# Patient Record
Sex: Female | Born: 1985 | Hispanic: Yes | Marital: Married | Smoking: Never smoker
Health system: Southern US, Community
[De-identification: ages and names within clinical notes are randomized; demographics above are authoritative.]

---

## 2007-07-27 ENCOUNTER — Emergency Department: Payer: Self-pay | Admitting: Emergency Medicine

## 2009-02-10 ENCOUNTER — Emergency Department: Payer: Self-pay | Admitting: Emergency Medicine

## 2009-11-05 ENCOUNTER — Emergency Department: Payer: Self-pay | Admitting: Emergency Medicine

## 2009-11-17 ENCOUNTER — Emergency Department: Payer: Self-pay | Admitting: Emergency Medicine

## 2010-04-07 ENCOUNTER — Observation Stay: Payer: Self-pay | Admitting: Obstetrics and Gynecology

## 2010-04-27 ENCOUNTER — Ambulatory Visit: Payer: Self-pay | Admitting: Family Medicine

## 2011-07-12 ENCOUNTER — Emergency Department: Payer: Self-pay | Admitting: Emergency Medicine

## 2011-07-12 LAB — URINALYSIS, COMPLETE
Bilirubin,UR: NEGATIVE
Glucose,UR: NEGATIVE mg/dL (ref 0–75)
Ketone: NEGATIVE
Nitrite: NEGATIVE
Protein: NEGATIVE
RBC,UR: 1 /HPF (ref 0–5)
Squamous Epithelial: 7
WBC UR: 2 /HPF (ref 0–5)

## 2011-07-12 LAB — COMPREHENSIVE METABOLIC PANEL
Albumin: 4.2 g/dL (ref 3.4–5.0)
BUN: 17 mg/dL (ref 7–18)
Calcium, Total: 8.9 mg/dL (ref 8.5–10.1)
Chloride: 106 mmol/L (ref 98–107)
Co2: 24 mmol/L (ref 21–32)
EGFR (African American): >60
EGFR (Non-African Amer.): >60
Osmolality: 277 (ref 275–301)
Potassium: 3.9 mmol/L (ref 3.5–5.1)
SGPT (ALT): 30 U/L
Total Protein: 8.2 g/dL (ref 6.4–8.2)

## 2011-07-12 LAB — CBC
HCT: 42.9 % (ref 35.0–47.0)
HGB: 14.2 g/dL (ref 12.0–16.0)
RBC: 4.7 10*6/uL (ref 3.80–5.20)
RDW: 13.3 % (ref 11.5–14.5)

## 2011-07-12 LAB — HCG, QUANTITATIVE, PREGNANCY: Beta Hcg, Quant.: <1 m[IU]/mL — ABNORMAL LOW

## 2014-03-12 ENCOUNTER — Emergency Department: Payer: Self-pay | Admitting: Emergency Medicine

## 2014-03-12 LAB — MAGNESIUM: Magnesium: 1.9 mg/dL

## 2014-03-12 LAB — CBC WITH DIFFERENTIAL/PLATELET
BASOS ABS: 0 10*3/uL (ref 0.0–0.1)
Basophil %: 0.8 %
EOS PCT: 1.7 %
Eosinophil #: 0.1 10*3/uL (ref 0.0–0.7)
HCT: 39.6 % (ref 35.0–47.0)
HGB: 13.3 g/dL (ref 12.0–16.0)
LYMPHS ABS: 2.2 10*3/uL (ref 1.0–3.6)
Lymphocyte %: 40.3 %
MCH: 31.1 pg (ref 26.0–34.0)
MCHC: 33.6 g/dL (ref 32.0–36.0)
MCV: 93 fL (ref 80–100)
Monocyte #: 0.3 x10 3/mm (ref 0.2–0.9)
Monocyte %: 6.4 %
NEUTROS ABS: 2.7 10*3/uL (ref 1.4–6.5)
Neutrophil %: 50.8 %
PLATELETS: 175 10*3/uL (ref 150–440)
RBC: 4.27 10*6/uL (ref 3.80–5.20)
RDW: 13.3 % (ref 11.5–14.5)
WBC: 5.4 10*3/uL (ref 3.6–11.0)

## 2014-03-12 LAB — BASIC METABOLIC PANEL
ANION GAP: 8 (ref 7–16)
BUN: 10 mg/dL (ref 7–18)
CALCIUM: 8.9 mg/dL (ref 8.5–10.1)
CO2: 26 mmol/L (ref 21–32)
CREATININE: 0.62 mg/dL (ref 0.60–1.30)
Chloride: 107 mmol/L (ref 98–107)
EGFR (Non-African Amer.): >60
Glucose: 101 mg/dL — ABNORMAL HIGH (ref 65–99)
Osmolality: 280 (ref 275–301)
Potassium: 3.7 mmol/L (ref 3.5–5.1)
Sodium: 141 mmol/L (ref 136–145)

## 2014-03-12 LAB — TROPONIN I

## 2015-03-03 ENCOUNTER — Other Ambulatory Visit: Payer: Self-pay | Admitting: Internal Medicine

## 2015-03-03 DIAGNOSIS — R102 Pelvic and perineal pain: Secondary | ICD-10-CM

## 2015-03-10 ENCOUNTER — Ambulatory Visit
Admission: RE | Admit: 2015-03-10 | Discharge: 2015-03-10 | Disposition: A | Discharge status: Home / Self Care | Payer: 59 | Source: Ambulatory Visit | Attending: Internal Medicine | Admitting: Internal Medicine

## 2015-03-10 ENCOUNTER — Other Ambulatory Visit (HOSPITAL_COMMUNITY): Payer: Self-pay | Admitting: Interventional Radiology

## 2015-03-10 DIAGNOSIS — R102 Pelvic and perineal pain: Secondary | ICD-10-CM | POA: Diagnosis not present

## 2015-03-10 DIAGNOSIS — Z975 Presence of (intrauterine) contraceptive device: Secondary | ICD-10-CM | POA: Diagnosis not present

## 2016-01-04 ENCOUNTER — Other Ambulatory Visit: Payer: Self-pay | Admitting: Physician Assistant

## 2016-01-04 DIAGNOSIS — R102 Pelvic and perineal pain: Secondary | ICD-10-CM

## 2016-01-09 ENCOUNTER — Ambulatory Visit: Admission: RE | Admit: 2016-01-09 | Payer: 59 | Source: Ambulatory Visit

## 2017-08-13 IMAGING — US US PELVIS COMPLETE
1 series · 14 of 25 positions shown · non-contrast
Comparison: None

CLINICAL DATA: Pelvic pain in female for 3 months.  IUD.

EXAM:
TRANSABDOMINAL AND TRANSVAGINAL ULTRASOUND OF PELVIS
TECHNIQUE: Both transabdominal and transvaginal ultrasound examinations of the
pelvis were performed. Transabdominal technique was performed for
global imaging of the pelvis including uterus, ovaries, adnexal
regions, and pelvic cul-de-sac. It was necessary to proceed with
endovaginal exam following the transabdominal exam to visualize the
endometrial thickness and ovaries.

[Series 1: us pelvis complete · 0.20mm/px · 14 of 111 slices shown]
[im 1/111]
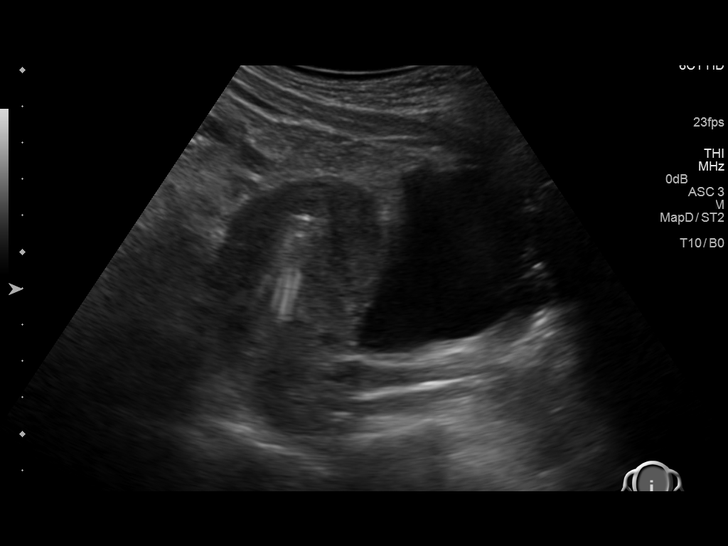
[im 10/111]
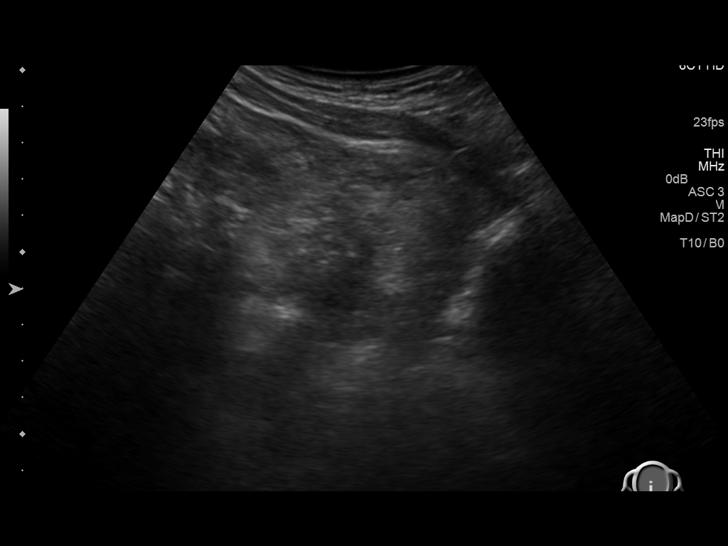
[im 19/111]
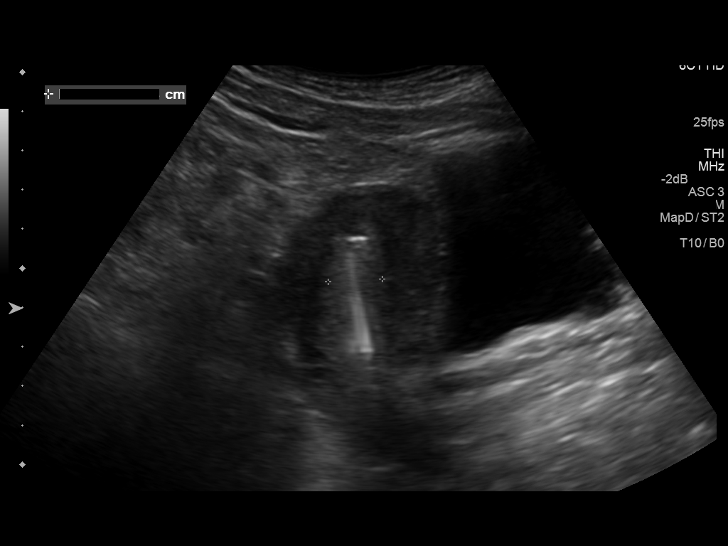
[im 28/111]
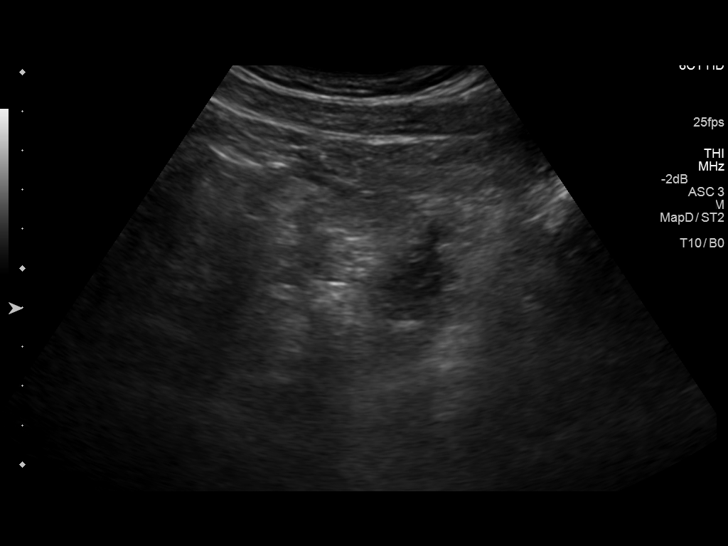
[im 37/111]
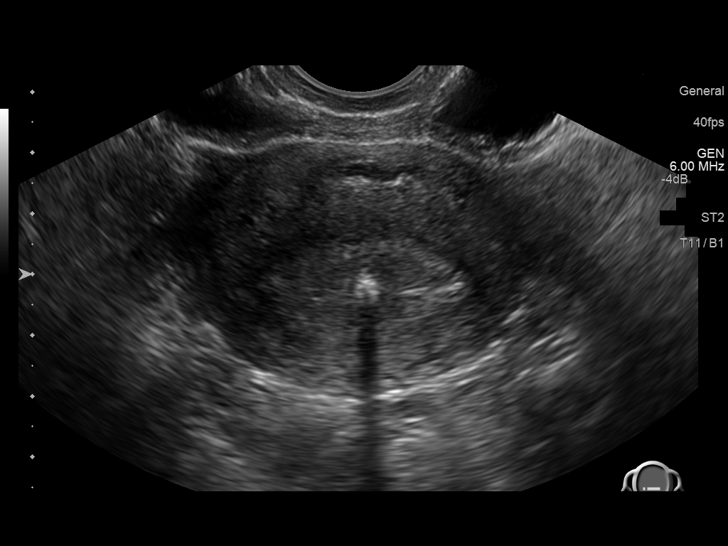
[im 42/111]
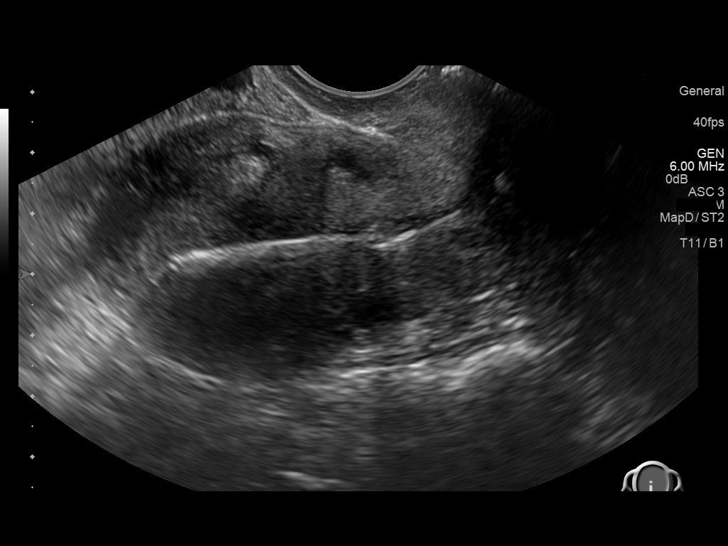
[im 51/111]
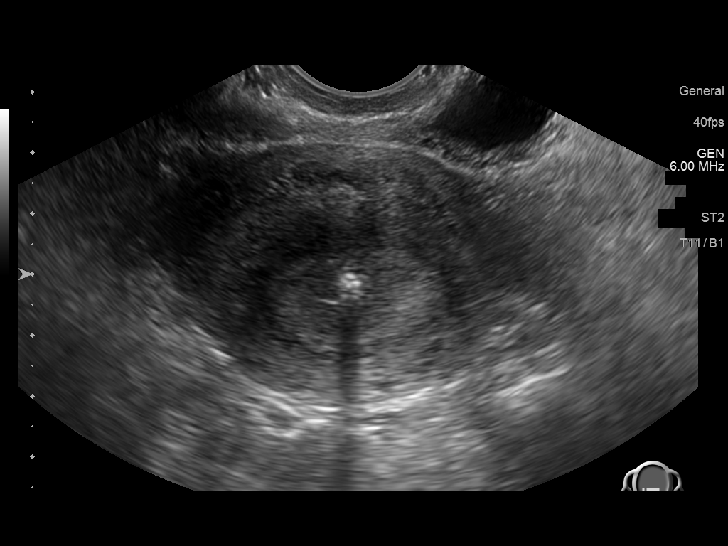
[im 60/111]
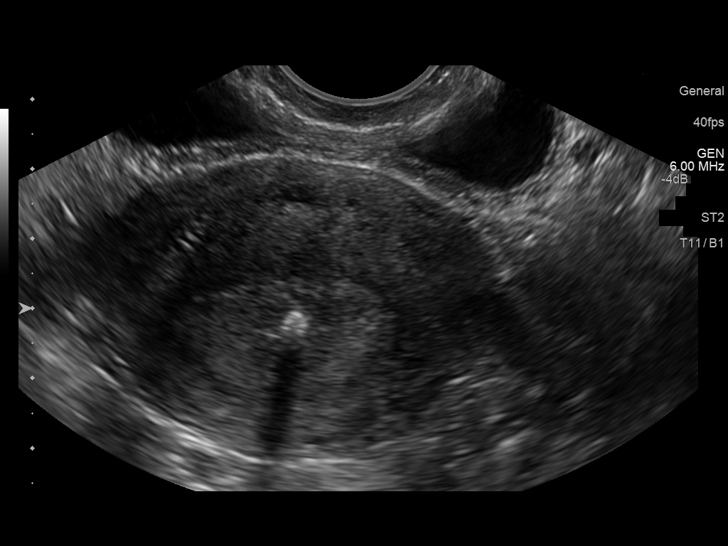
[im 69/111]
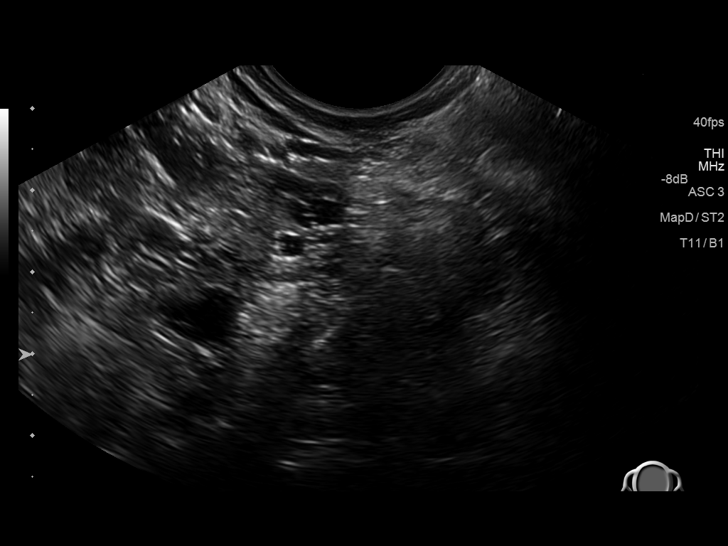
[im 74/111]
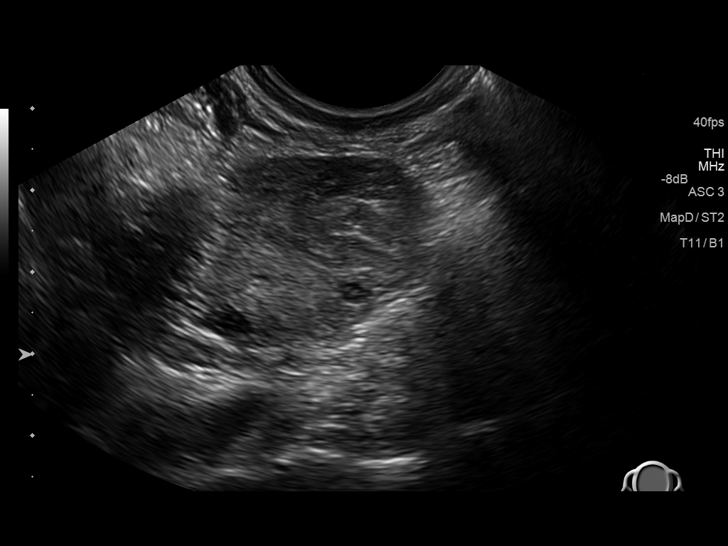
[im 83/111]
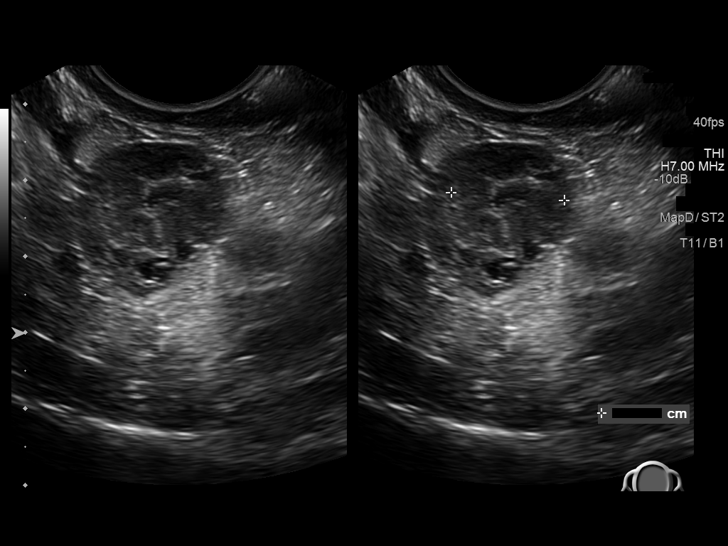
[im 92/111]
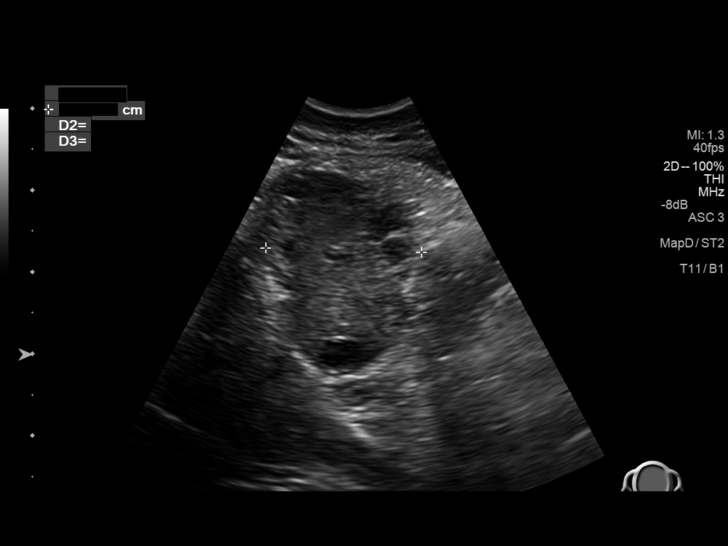
[im 101/111]
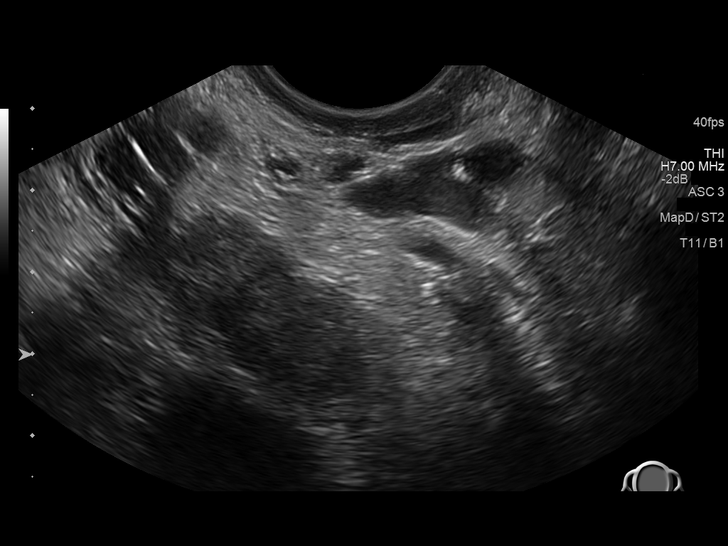
[im 111/111]
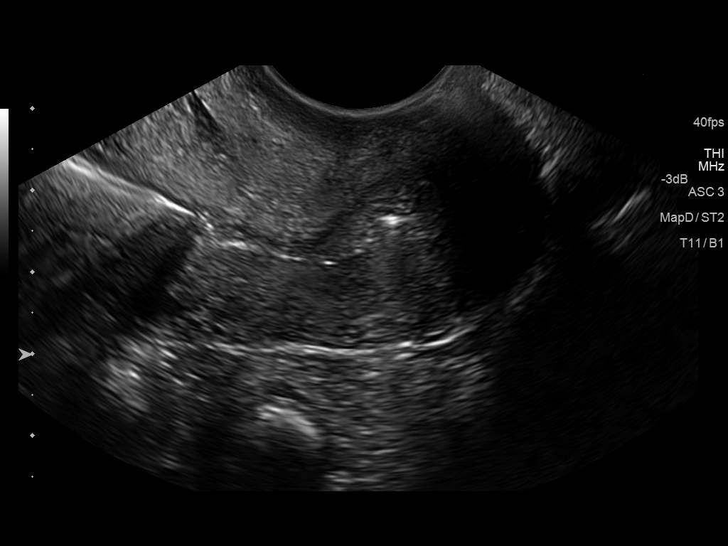

[14 of 25 positions shown; findings below may reference images not displayed]

FINDINGS: Uterus

Measurements: 8.4 x 4.6 x 5.7 cm. No fibroids or other mass
visualized.

Endometrium

Thickness: 18 mm. IUD seen in expected position within endometrial
cavity.

Right ovary

Measurements: 3.2 x 2.3 x 1.9 cm. Small corpus luteum noted. Normal
appearance/no adnexal mass.

Left ovary

Measurements: 3.2 x 1.3 x 1.6 cm. Normal appearance/no adnexal mass.

Other findings

No free fluid.
IMPRESSION: No fibroids identified. Endometrial thickness measures 18 mm, with
IUD visualized within endometrial cavity.

Normal appearance of both ovaries.  No adnexal mass identified.

## 2018-12-06 ENCOUNTER — Encounter: Payer: Self-pay | Admitting: Emergency Medicine

## 2018-12-06 ENCOUNTER — Other Ambulatory Visit: Payer: Self-pay

## 2018-12-06 DIAGNOSIS — M549 Dorsalgia, unspecified: Secondary | ICD-10-CM | POA: Diagnosis present

## 2018-12-06 DIAGNOSIS — N200 Calculus of kidney: Secondary | ICD-10-CM | POA: Diagnosis not present

## 2018-12-06 LAB — COMPREHENSIVE METABOLIC PANEL
ALT: 115 U/L — ABNORMAL HIGH (ref 0–44)
AST: 64 U/L — ABNORMAL HIGH (ref 15–41)
Albumin: 4.6 g/dL (ref 3.5–5.0)
Alkaline Phosphatase: 146 U/L — ABNORMAL HIGH (ref 38–126)
Anion gap: 9 (ref 5–15)
BUN: 17 mg/dL (ref 6–20)
CO2: 24 mmol/L (ref 22–32)
Calcium: 9.5 mg/dL (ref 8.9–10.3)
Chloride: 109 mmol/L (ref 98–111)
Creatinine, Ser: 0.7 mg/dL (ref 0.44–1.00)
GFR calc Af Amer: >60 mL/min (ref >60)
GFR calc non Af Amer: >60 mL/min (ref >60)
Glucose, Bld: 111 mg/dL — ABNORMAL HIGH (ref 70–99)
Potassium: 3.8 mmol/L (ref 3.5–5.1)
Sodium: 142 mmol/L (ref 135–145)
Total Bilirubin: 0.6 mg/dL (ref 0.3–1.2)
Total Protein: 7.6 g/dL (ref 6.5–8.1)

## 2018-12-06 LAB — CBC
HCT: 39.2 % (ref 36.0–46.0)
Hemoglobin: 13.1 g/dL (ref 12.0–15.0)
MCH: 29.6 pg (ref 26.0–34.0)
MCHC: 33.4 g/dL (ref 30.0–36.0)
MCV: 88.5 fL (ref 80.0–100.0)
Platelets: 196 10*3/uL (ref 150–400)
RBC: 4.43 MIL/uL (ref 3.87–5.11)
RDW: 12.9 % (ref 11.5–15.5)
WBC: 7.2 10*3/uL (ref 4.0–10.5)
nRBC: 0 % (ref 0.0–0.2)

## 2018-12-06 LAB — URINALYSIS, COMPLETE (UACMP) WITH MICROSCOPIC
Bilirubin Urine: NEGATIVE
Glucose, UA: NEGATIVE mg/dL
Ketones, ur: NEGATIVE mg/dL
Nitrite: NEGATIVE
Protein, ur: 100 mg/dL — AB
RBC / HPF: >50 RBC/hpf — ABNORMAL HIGH (ref 0–5)
Specific Gravity, Urine: 1.025 (ref 1.005–1.030)
pH: 8 (ref 5.0–8.0)

## 2018-12-06 LAB — POCT PREGNANCY, URINE: Preg Test, Ur: NEGATIVE

## 2018-12-06 LAB — LIPASE, BLOOD: Lipase: 35 U/L (ref 11–51)

## 2018-12-06 MED ORDER — KETOROLAC TROMETHAMINE 30 MG/ML IJ SOLN
30.0000 mg | Freq: Once | INTRAMUSCULAR | Status: AC
  Administered 2018-12-06: 30 mg via INTRAVENOUS
  Filled 2018-12-06: qty 1

## 2018-12-06 MED ORDER — SODIUM CHLORIDE 0.9 % IV BOLUS
1000.0000 mL | Freq: Once | INTRAVENOUS | Status: AC
  Administered 2018-12-06: 1000 mL via INTRAVENOUS

## 2018-12-06 MED ORDER — ONDANSETRON HCL 4 MG/2ML IJ SOLN
4.0000 mg | Freq: Once | INTRAMUSCULAR | Status: AC
  Administered 2018-12-06: 23:00:00 4 mg via INTRAVENOUS
  Filled 2018-12-06: qty 2

## 2018-12-06 MED ORDER — FENTANYL CITRATE (PF) 100 MCG/2ML IJ SOLN
50.0000 ug | INTRAMUSCULAR | Status: DC | PRN
  Administered 2018-12-06: 22:00:00 50 ug via INTRAVENOUS
  Filled 2018-12-06: qty 2

## 2018-12-06 NOTE — ED Notes (Signed)
Pt in bathroom to attempt to collect urine specimen; unsure she can void at this time but she will try

## 2018-12-06 NOTE — ED Notes (Signed)
Pt understands she needs to pump her breast milk for the next 24 hours and dump it out;

## 2018-12-06 NOTE — ED Triage Notes (Signed)
Pt reports left back pain that radiates around the left flank to left lower abd; denies fever; some nausea, no vomiting; reports frank blood in her urine; pt moaning in wheelchair;

## 2018-12-06 NOTE — ED Notes (Signed)
Pt assisted to BR via wheelchair to void; voices no relief from pain medication yet

## 2018-12-06 NOTE — ED Notes (Signed)
Pt able to void small amount; small blood clots noted in urine; pt in worse pain than before she voided

## 2018-12-07 ENCOUNTER — Emergency Department
Admission: EM | Admit: 2018-12-07 | Discharge: 2018-12-07 | Disposition: A | Discharge status: Home / Self Care | Payer: 59 | Attending: Emergency Medicine | Admitting: Emergency Medicine

## 2018-12-07 DIAGNOSIS — N200 Calculus of kidney: Secondary | ICD-10-CM

## 2018-12-07 MED ORDER — KETOROLAC TROMETHAMINE 10 MG PO TABS: 10.0000 mg | ORAL_TABLET | Freq: Three times a day (TID) | ORAL | 0 refills | Status: AC | PRN

## 2018-12-07 MED ORDER — ONDANSETRON HCL 4 MG PO TABS: 4.0000 mg | ORAL_TABLET | Freq: Three times a day (TID) | ORAL | 0 refills | Status: AC | PRN

## 2018-12-07 NOTE — ED Notes (Signed)
Peripheral IV discontinued. Catheter intact. No signs of infiltration or redness. Gauze applied to IV site.    Discharge instructions reviewed with patient. Questions fielded by this RN. Patient verbalizes understanding of instructions. Patient discharged home in stable condition per goodman. No acute distress noted at time of discharge.    

## 2018-12-07 NOTE — ED Provider Notes (Signed)
[facility] Emergency Department Provider Note  ____________________________________________   I have reviewed the triage vital signs and the nursing notes.   HISTORY  Chief Complaint Hematuria, Back Pain, Flank Pain, and Abdominal Pain   History limited by: Language Barrier - Hospital Interpreter utilized   HPI Jocelyn Bean is a 33 y.o. female who presents to the emergency department today because of concern for left sided back and abdominal pain.  She states the pain started at roughly 9 PM yesterday evening.  She just eaten a little bit before the pain started.  She states the pain initially was in her back and then started radiating towards her abdomen.  She did have associated nausea and vomiting.  She did notice some blood in her urine when she gave a urine sample today.  Denies any recent dysuria or bad odor to her urine.  Denies any fevers.  Denies similar symptoms in the past.  By the time my exam the patient states that she felt much improved.  She thought that the second medication given to her for pain helped significantly.   Records reviewed. Per medical record review patient has a history of recent pregnancy.   History reviewed. No pertinent past medical history.  There are no active problems to display for this patient.   History reviewed. No pertinent surgical history.  Prior to Admission medications   Not on File    Allergies Patient has no known allergies.  History reviewed. No pertinent family history.  Social History Social History   Tobacco Use  . Smoking status: Never Smoker  . Smokeless tobacco: Never Used  Substance Use Topics  . Alcohol use: Never    Frequency: Never  . Drug use: Never    Review of Systems Constitutional: No fever/chills Eyes: No visual changes. ENT: No sore throat. Cardiovascular: Denies chest pain. Respiratory: Denies shortness of breath. Gastrointestinal: Positive for left abdominal pain,  nausea and vomiting.   Genitourinary: Negative for dysuria. Musculoskeletal: Positive for left lower back pain. Skin: Negative for rash. Neurological: Negative for headaches, focal weakness or numbness.  ____________________________________________   PHYSICAL EXAM:  VITAL SIGNS: ED Triage Vitals  Enc Vitals Group     BP 12/06/18 2200 (!) 142/81     Pulse Rate 12/06/18 2200 70     Resp 12/06/18 2200 17     Temp 12/06/18 2200 98.2 F (36.8 C)     Temp Source 12/06/18 2200 Oral     SpO2 12/06/18 2200 100 %     Weight 12/06/18 2155 125 lb (56.7 kg)     Height 12/06/18 2155 5' 3" (1.6 m)     Head Circumference --      Peak Flow --      Pain Score 12/06/18 2155 10   Constitutional: Alert and oriented.  Eyes: Conjunctivae are normal.  ENT      Head: Normocephalic and atraumatic.      Nose: No congestion/rhinnorhea.      Mouth/Throat: Mucous membranes are moist.      Neck: No stridor. Hematological/Lymphatic/Immunilogical: No cervical lymphadenopathy. Cardiovascular: Normal rate, regular rhythm.  No murmurs, rubs, or gallops.  Respiratory: Normal respiratory effort without tachypnea nor retractions. Breath sounds are clear and equal bilaterally. No wheezes/rales/rhonchi. Gastrointestinal: Soft and non tender. No rebound. No guarding.  Genitourinary: Deferred Musculoskeletal: Normal range of motion in all extremities. No lower extremity edema. Neurologic:  Normal speech and language. No gross focal neurologic deficits are appreciated.  Skin:  Skin   is warm, dry and intact. No rash noted. Psychiatric: Mood and affect are normal. Speech and behavior are normal. Patient exhibits appropriate insight and judgment.  ____________________________________________    LABS (pertinent positives/negatives)  Upreg negative CMP wnl except glu 111, ast 64, alt 115, alk phos 146 UA cloudy, large hgb dipstick, protein 100, small leukocytes, >50rbcs, 21-50 wbc, rare bacteria, 11-20 squamous  epi CBC ____________________________________________   EKG  None  ____________________________________________    RADIOLOGY  None  ____________________________________________   PROCEDURES  Procedures  ____________________________________________   INITIAL IMPRESSION / ASSESSMENT AND PLAN / ED COURSE  Pertinent labs & imaging results that were available during my care of the patient were reviewed by me and considered in my medical decision making (see chart for details).   Patient presented to the emergency department today because of concerns for left lower back pain that radiated to her abdomen.  This was accompanied by nausea and vomiting.  Urine does have red blood cells.  Patient felt better after Toradol shot.  I had a discussion with the patient.  At this point given clinical history and response to medication I do think she likely suffering from a kidney stone.  Did discuss with patient that the best way to tell for sure would be a CT scan however we did discuss the risk of radiation.  Patient felt comfortable deferring CT scan at this time which I think is completely reasonable.  Discussed treatment for kidney stones.  The patient is currently breast-feeding so did recommend switching to formula for the time being if she required further pain medication.  Did discuss return precautions including fever.   ____________________________________________   FINAL CLINICAL IMPRESSION(S) / ED DIAGNOSES  Final diagnoses:  Kidney stone     Note: This dictation was prepared with Dragon dictation. Any transcriptional errors that result from this process are unintentional     Nance Pear, MD 12/07/18 [id number]

## 2018-12-07 NOTE — Discharge Instructions (Addendum)
Please seek medical attention for any high fevers, chest pain, shortness of breath, change in behavior, persistent vomiting, bloody stool or any other new or concerning symptoms.  

## 2018-12-07 NOTE — ED Notes (Addendum)
Reports pain to left lower back that radiates into left abd area. Also c/o of n/v. Reports 0/10 pain at present time. States medication given really helped.

## 2020-01-21 ENCOUNTER — Other Ambulatory Visit: Payer: Self-pay

## 2020-01-21 ENCOUNTER — Emergency Department
Admission: EM | Admit: 2020-01-21 | Discharge: 2020-01-21 | Disposition: A | Discharge status: Home / Self Care | Payer: PRIVATE HEALTH INSURANCE | Attending: Emergency Medicine | Admitting: Emergency Medicine

## 2020-01-21 ENCOUNTER — Emergency Department: Payer: PRIVATE HEALTH INSURANCE

## 2020-01-21 DIAGNOSIS — R1030 Lower abdominal pain, unspecified: Secondary | ICD-10-CM | POA: Insufficient documentation

## 2020-01-21 DIAGNOSIS — O99352 Diseases of the nervous system complicating pregnancy, second trimester: Secondary | ICD-10-CM | POA: Diagnosis present

## 2020-01-21 DIAGNOSIS — R55 Syncope and collapse: Secondary | ICD-10-CM | POA: Insufficient documentation

## 2020-01-21 DIAGNOSIS — O26892 Other specified pregnancy related conditions, second trimester: Secondary | ICD-10-CM | POA: Diagnosis not present

## 2020-01-21 DIAGNOSIS — Z3A14 14 weeks gestation of pregnancy: Secondary | ICD-10-CM | POA: Diagnosis not present

## 2020-01-21 DIAGNOSIS — R102 Pelvic and perineal pain: Secondary | ICD-10-CM

## 2020-01-21 DIAGNOSIS — M549 Dorsalgia, unspecified: Secondary | ICD-10-CM | POA: Diagnosis not present

## 2020-01-21 LAB — CBC
HCT: 34.8 % — ABNORMAL LOW (ref 36.0–46.0)
Hemoglobin: 12 g/dL (ref 12.0–15.0)
MCH: 31.2 pg (ref 26.0–34.0)
MCHC: 34.5 g/dL (ref 30.0–36.0)
MCV: 90.4 fL (ref 80.0–100.0)
Platelets: 172 10*3/uL (ref 150–400)
RBC: 3.85 MIL/uL — ABNORMAL LOW (ref 3.87–5.11)
RDW: 13.6 % (ref 11.5–15.5)
WBC: 6.6 10*3/uL (ref 4.0–10.5)
nRBC: 0 % (ref 0.0–0.2)

## 2020-01-21 LAB — URINALYSIS, COMPLETE (UACMP) WITH MICROSCOPIC
Bilirubin Urine: NEGATIVE
Glucose, UA: NEGATIVE mg/dL
Hgb urine dipstick: NEGATIVE
Ketones, ur: NEGATIVE mg/dL
Leukocytes,Ua: NEGATIVE
Nitrite: NEGATIVE
Protein, ur: NEGATIVE mg/dL
Specific Gravity, Urine: 1.012 (ref 1.005–1.030)
pH: 7 (ref 5.0–8.0)

## 2020-01-21 LAB — BASIC METABOLIC PANEL
Anion gap: 8 (ref 5–15)
BUN: 8 mg/dL (ref 6–20)
CO2: 21 mmol/L — ABNORMAL LOW (ref 22–32)
Calcium: 8.9 mg/dL (ref 8.9–10.3)
Chloride: 106 mmol/L (ref 98–111)
Creatinine, Ser: 0.35 mg/dL — ABNORMAL LOW (ref 0.44–1.00)
GFR, Estimated: >60 mL/min (ref >60)
Glucose, Bld: 113 mg/dL — ABNORMAL HIGH (ref 70–99)
Potassium: 3.9 mmol/L (ref 3.5–5.1)
Sodium: 135 mmol/L (ref 135–145)

## 2020-01-21 LAB — POC URINE PREG, ED: Preg Test, Ur: POSITIVE — AB

## 2020-01-21 NOTE — ED Triage Notes (Signed)
Reports abdominal pain, lower back pain and episode of near syncope this AM. Pt states [redacted] weeks pregnant, no vaginal bleeding.  Pt alert and oriented X4, cooperative, RR even and unlabored, color WNL. Pt in NAD. interpreter used for triage.

## 2020-01-21 NOTE — ED Notes (Signed)
Ambulatory into ED. Interpreter requested.
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
## 2021-05-05 ENCOUNTER — Ambulatory Visit
Admission: EM | Admit: 2021-05-05 | Discharge: 2021-05-05 | Disposition: A | Discharge status: Home / Self Care | Payer: PRIVATE HEALTH INSURANCE

## 2021-05-05 ENCOUNTER — Encounter: Payer: Self-pay | Admitting: Emergency Medicine

## 2021-05-05 ENCOUNTER — Other Ambulatory Visit: Payer: Self-pay

## 2021-05-05 DIAGNOSIS — B349 Viral infection, unspecified: Secondary | ICD-10-CM | POA: Diagnosis not present

## 2021-05-05 NOTE — ED Triage Notes (Signed)
Pt c/o HA, abdominal pain, bodyaches, dizziness, and diarrhea sxs started last night.

## 2021-05-05 NOTE — Discharge Instructions (Addendum)
Your COVID and Flu tests are pending.  You should self quarantine until the test results are back.    Take Tylenol or ibuprofen as needed for fever or discomfort.  Rest and keep yourself hydrated.    Follow-up with your primary care provider if your symptoms are not improving.     

## 2021-05-05 NOTE — ED Provider Notes (Signed)
[facility]    CSN: [id number] Arrival date & time: 05/05/21  [id number]      History   Chief Complaint Chief Complaint  Patient presents with   Headache   Abdominal Pain   Generalized Body Aches   Dizziness   Diarrhea    HPI Jocelyn Bean is a 36 y.o. female.  Patient presents with headache, body aches, dizziness, abdominal pain, nausea, and diarrhea since last night.  She denies fever, chills, rash, ear pain, sore throat, cough, shortness of breath, vomiting, dysuria, hematuria, or other symptoms.  Treatment at home with Tylenol and ibuprofen.  She has an infant and is breast-feeding.  The history is provided by the patient. A language interpreter was used.   History reviewed. No pertinent past medical history.  There are no problems to display for this patient.   History reviewed. No pertinent surgical history.  OB History     Gravida  1   Para      Term      Preterm      AB      Living         SAB      IAB      Ectopic      Multiple      Live Births               Home Medications    Prior to Admission medications   Medication Sig Start Date End Date Taking? Authorizing Provider  Prenatal Vit-Iron Carbonyl-FA (PRENATABS RX) 29-1 MG TABS Take 1 tablet by mouth daily. 04/18/20  Yes [provider]  ketorolac (TORADOL) 10 MG tablet Take 1 tablet (10 mg total) by mouth every 8 (eight) hours as needed for severe pain. 12/07/18   Nance Pear, MD  norethindrone-ethinyl estradiol (LOESTRIN) 1-20 MG-MCG tablet Take 1 tablet by mouth daily. 10/17/17   [provider]  ondansetron (ZOFRAN) 4 MG tablet Take 1 tablet (4 mg total) by mouth every 8 (eight) hours as needed. 12/07/18   Nance Pear, MD    Family History History reviewed. No pertinent family history.  Social History Social History   Tobacco Use   Smoking status: Never   Smokeless tobacco: Never  Vaping Use   Vaping Use: Never used  Substance Use  Topics   Alcohol use: Never   Drug use: Never     Allergies   Patient has no known allergies.   Review of Systems Review of Systems  Constitutional:  Negative for chills and fever.  HENT:  Negative for ear pain and sore throat.   Respiratory:  Negative for cough and shortness of breath.   Cardiovascular:  Negative for chest pain and palpitations.  Gastrointestinal:  Positive for abdominal pain, diarrhea and nausea. Negative for vomiting.  Genitourinary:  Negative for dysuria and hematuria.  Skin:  Negative for color change and rash.  Neurological:  Positive for dizziness and headaches. Negative for weakness and numbness.  All other systems reviewed and are negative.   Physical Exam Triage Vital Signs ED Triage Vitals  Enc Vitals Group     BP 05/05/21 1838 109/76     Pulse Rate 05/05/21 1838 83     Resp 05/05/21 1838 18     Temp 05/05/21 1838 98.2 F (36.8 C)     Temp src --      SpO2 05/05/21 1838 98 %     Weight --      Height --  Head Circumference --      Peak Flow --      Pain Score 05/05/21 1846 5     Pain Loc --      Pain Edu? --      Excl. in [place]? --    No data found.  Updated Vital Signs BP 109/76    Pulse 83    Temp 98.2 F (36.8 C)    Resp 18    SpO2 98%    Breastfeeding Yes   Visual Acuity Right Eye Distance:   Left Eye Distance:   Bilateral Distance:    Right Eye Near:   Left Eye Near:    Bilateral Near:     Physical Exam Vitals and nursing note reviewed.  Constitutional:      General: She is not in acute distress.    Appearance: Normal appearance. She is well-developed. She is not ill-appearing.  HENT:     Right Ear: Tympanic membrane normal.     Left Ear: Tympanic membrane normal.     Nose: Nose normal.     Mouth/Throat:     Mouth: Mucous membranes are moist.     Pharynx: Oropharynx is clear.  Cardiovascular:     Rate and Rhythm: Normal rate and regular rhythm.     Heart sounds: Normal heart sounds.  Pulmonary:     Effort:  Pulmonary effort is normal. No respiratory distress.     Breath sounds: Normal breath sounds.  Abdominal:     General: Bowel sounds are normal.     Palpations: Abdomen is soft.     Tenderness: There is no abdominal tenderness. There is no guarding or rebound.  Musculoskeletal:     Cervical back: Neck supple.  Skin:    General: Skin is warm and dry.  Neurological:     General: No focal deficit present.     Mental Status: She is alert and oriented to person, place, and time.     Sensory: No sensory deficit.     Motor: No weakness.     Gait: Gait normal.  Psychiatric:        Mood and Affect: Mood normal.        Behavior: Behavior normal.     UC Treatments / Results  Labs (all labs ordered are listed, but only abnormal results are displayed) Labs Reviewed  COVID-19, FLU A+B NAA    EKG   Radiology No results found.  Procedures Procedures (including critical care time)  Medications Ordered in UC Medications - No data to display  Initial Impression / Assessment and Plan / UC Course  I have reviewed the triage vital signs and the nursing notes.  Pertinent labs & imaging results that were available during my care of the patient were reviewed by me and considered in my medical decision making (see chart for details).    Viral illness.  Patient is breast-feeding. COVID and Flu pending.  Instructed patient to self quarantine per CDC guidelines.  Discussed symptomatic treatment including Tylenol or ibuprofen, rest, hydration.  Instructed patient to follow up with PCP if symptoms are not improving.  Patient agrees to plan of care.   Final Clinical Impressions(s) / UC Diagnoses   Final diagnoses:  Viral illness     Discharge Instructions      Your COVID and Flu tests are pending.  You should self quarantine until the test results are back.    Take Tylenol or ibuprofen as needed for fever or discomfort.  Rest and  keep yourself hydrated.    Follow-up with your primary  care provider if your symptoms are not improving.         ED Prescriptions   None    PDMP not reviewed this encounter.   Sharion Balloon, NP 05/05/21 [phone]

## 2021-05-08 LAB — COVID-19, FLU A+B NAA
Influenza A, NAA: NOT DETECTED
Influenza B, NAA: NOT DETECTED
SARS-CoV-2, NAA: NOT DETECTED

## 2022-05-11 IMAGING — US US OB COMP LESS 14 WK
1 series · 14 of 28 positions shown · non-contrast
Comparison: None

CLINICAL DATA: Lower pelvic pain, pregnant; assigned EGA 13 weeks 6
days

EXAM:
OBSTETRIC <14 WK ULTRASOUND
TECHNIQUE: Transabdominal ultrasound was performed for evaluation of the
gestation as well as the maternal uterus and adnexal regions.

[Series 1: ob us · 14 of 41 slices shown]
[im 2/41]
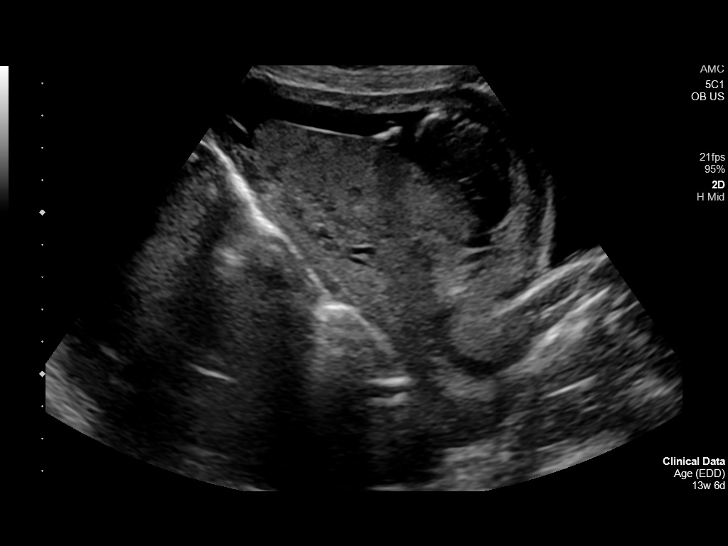
[im 5/41]
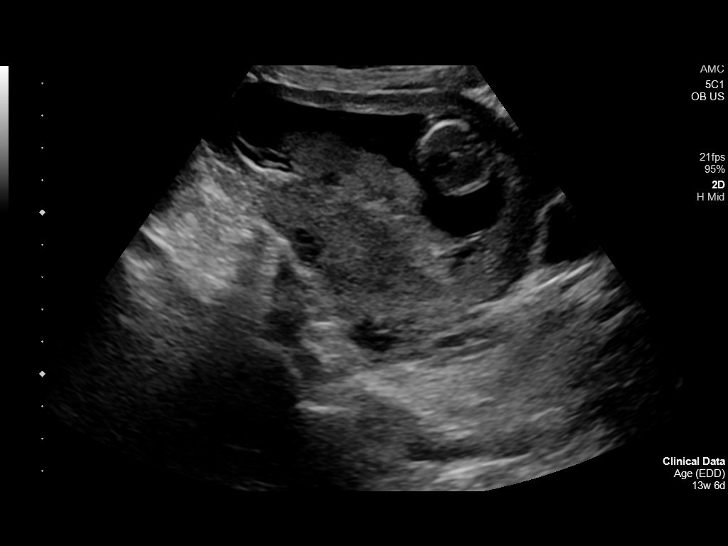
[im 8/41]
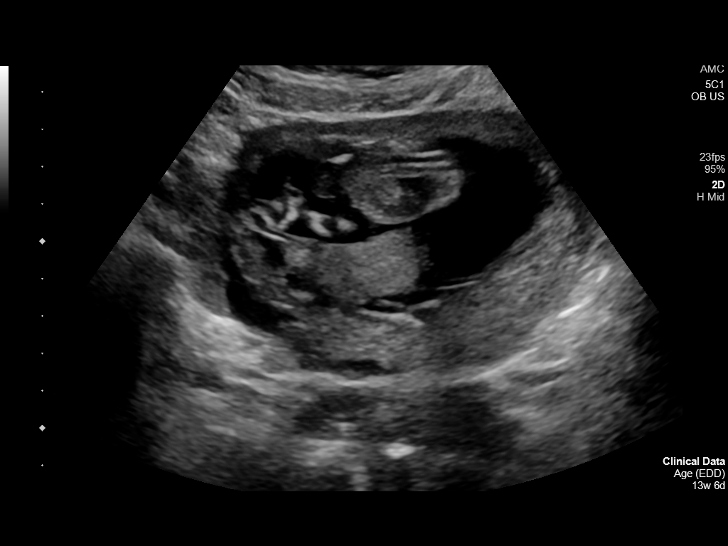
[im 11/41]
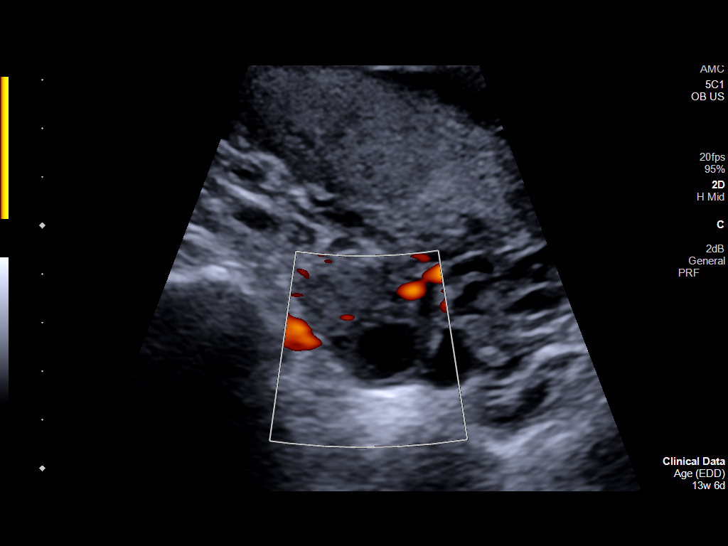
[im 14/41]
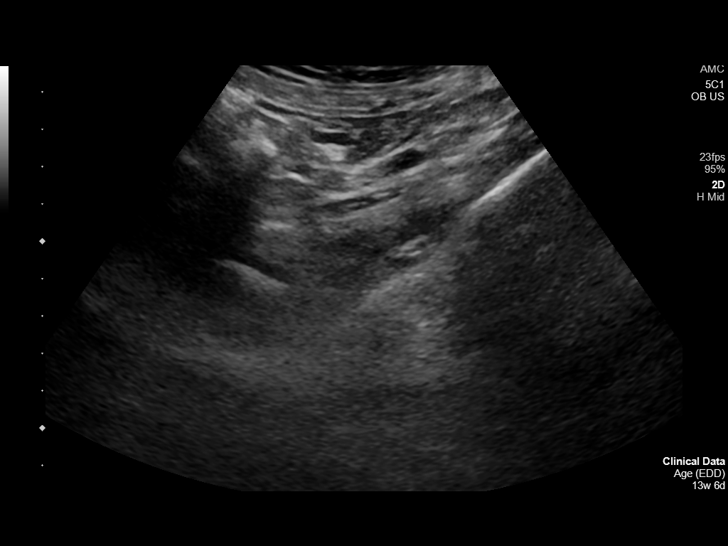
[im 17/41]
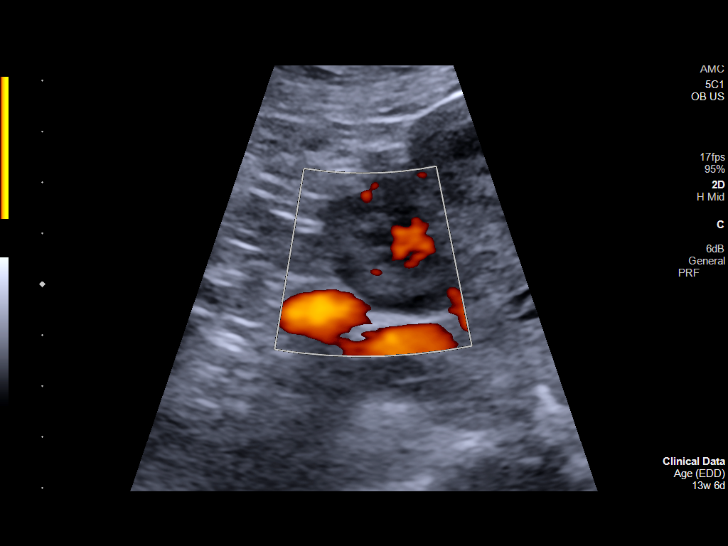
[im 20/41]
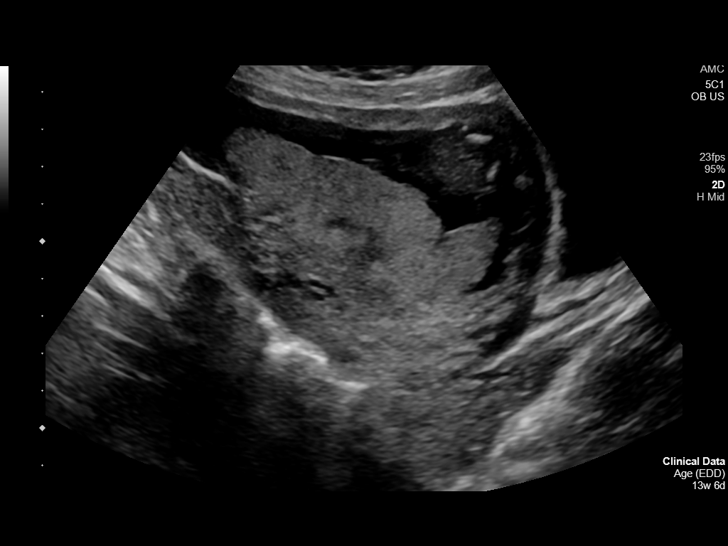
[im 23/41]
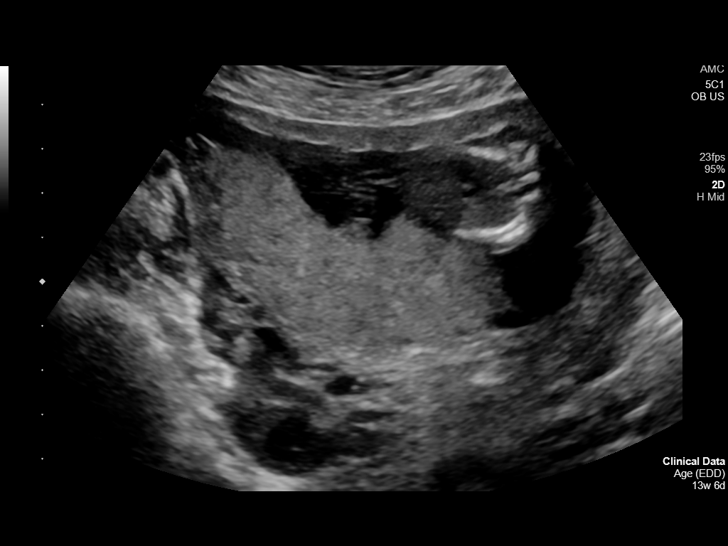
[im 26/41]
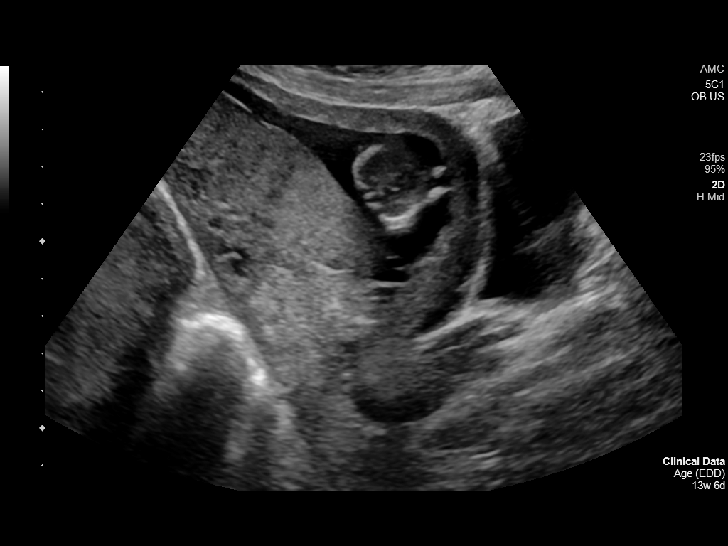
[im 29/41]
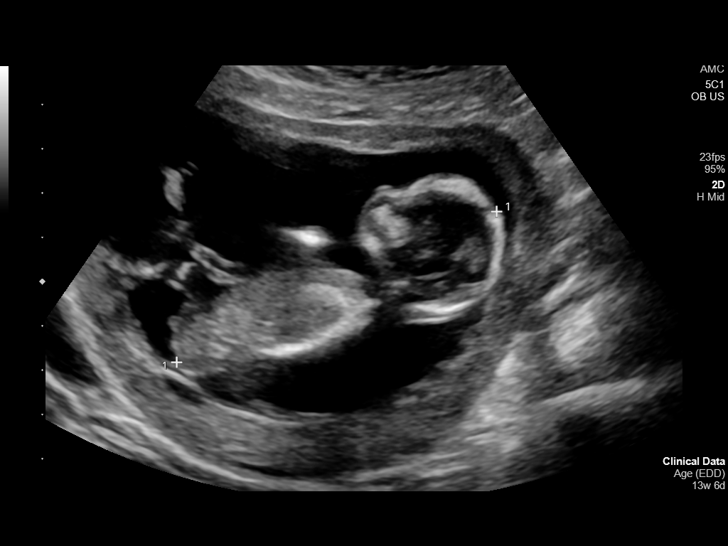
[im 32/41]
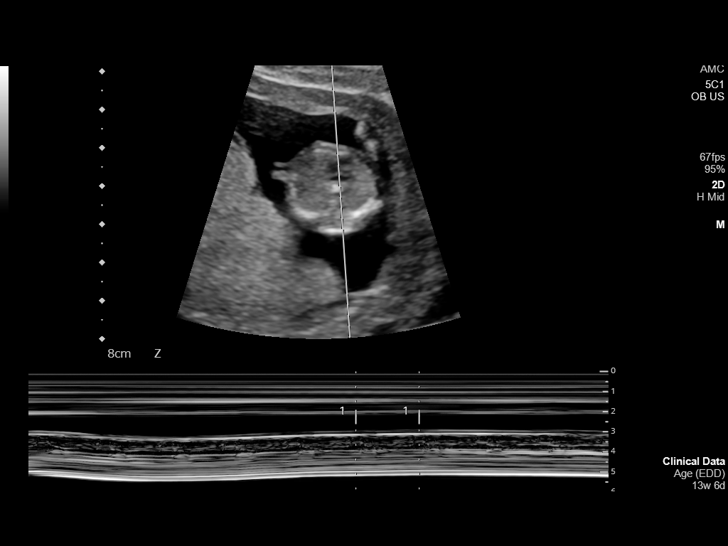
[im 35/41]
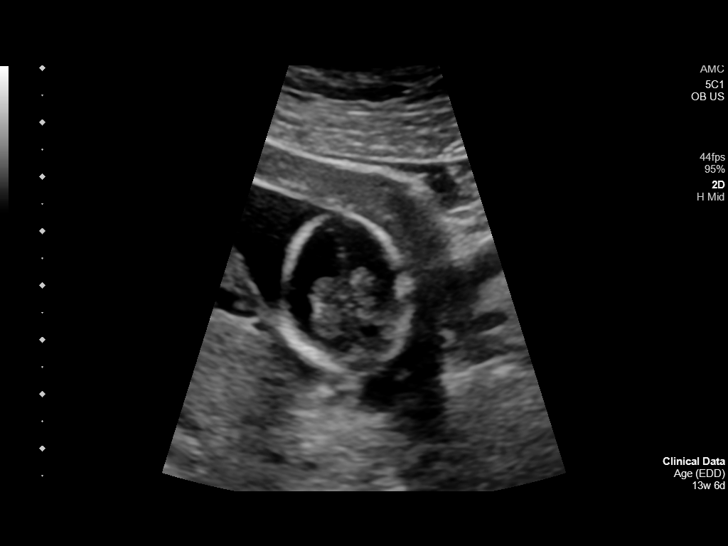
[im 38/41]
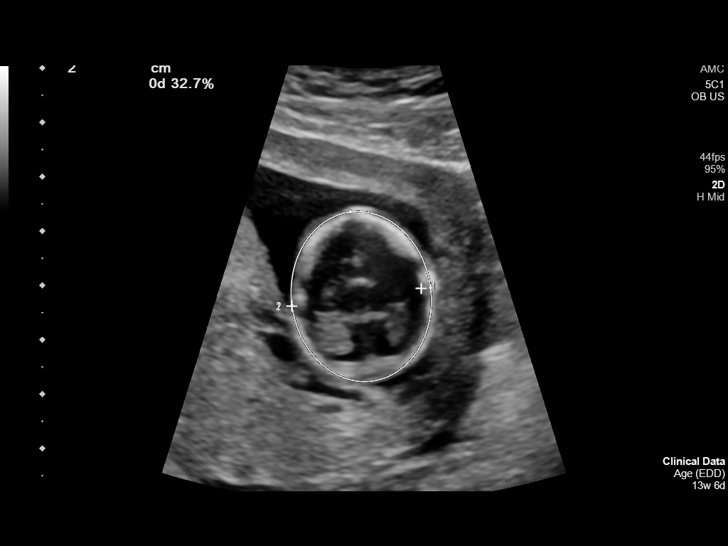
[im 41/41]
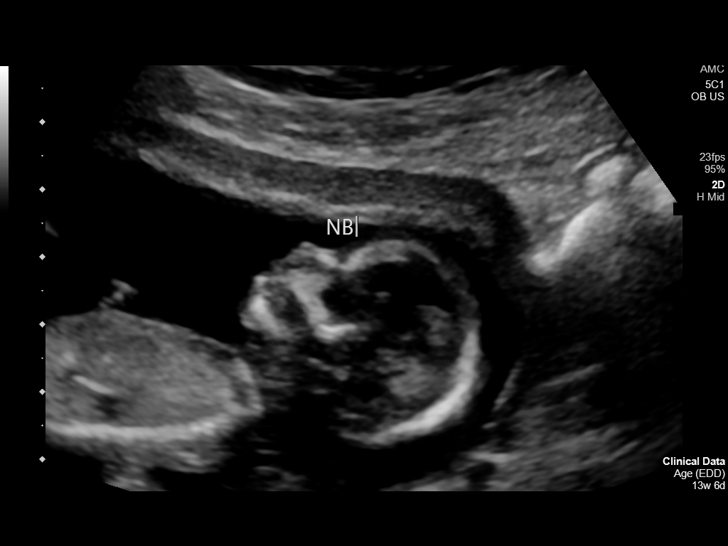

[14 of 28 positions shown; findings below may reference images not displayed]

FINDINGS: Intrauterine gestational sac: Present, single

Yolk sac:  Present

Embryo:  Present

Cardiac Activity: Present

Heart Rate: 162 bpm

CRL:   80.6 mm   14 w 0 d                  US EDC: 07/21/2020

Subchorionic hemorrhage:  None visualized.

Maternal uterus/adnexae:

Developing placenta is posterior in position.

Uterus otherwise normal appearance.

LEFT ovary normal size and morphology, 2.9 x 1.5 x 1.5 cm.

RIGHT ovary normal size and morphology, 2.1 x 3.0 x 2.3 cm.

No free pelvic fluid or adnexal masses.
IMPRESSION: Single live intrauterine gestation a 14 weeks 0 days EGA by
crown-rump length.

No acute abnormalities.

## 2023-11-15 ENCOUNTER — Other Ambulatory Visit: Payer: Self-pay | Admitting: Internal Medicine

## 2023-11-15 DIAGNOSIS — Z1231 Encounter for screening mammogram for malignant neoplasm of breast: Secondary | ICD-10-CM

## 2024-03-20 ENCOUNTER — Ambulatory Visit
Admission: EM | Admit: 2024-03-20 | Discharge: 2024-03-20 | Disposition: A | Discharge status: Home / Self Care | Payer: PRIVATE HEALTH INSURANCE | Attending: Emergency Medicine | Admitting: Emergency Medicine

## 2024-03-20 DIAGNOSIS — J101 Influenza due to other identified influenza virus with other respiratory manifestations: Secondary | ICD-10-CM

## 2024-03-20 LAB — POCT RAPID STREP A (OFFICE): Rapid Strep A Screen: NEGATIVE

## 2024-03-20 LAB — POCT INFLUENZA A/B
Influenza A, POC: NEGATIVE
Influenza B, POC: POSITIVE — AB

## 2024-03-20 LAB — POC SOFIA SARS ANTIGEN FIA: SARS Coronavirus 2 Ag: NEGATIVE

## 2024-03-20 MED ORDER — PROMETHAZINE-DM 6.25-15 MG/5ML PO SYRP: 5.0000 mL | ORAL_SOLUTION | Freq: Four times a day (QID) | ORAL | 0 refills | Status: AC | PRN

## 2024-03-20 MED ORDER — BENZONATATE 100 MG PO CAPS: 100.0000 mg | ORAL_CAPSULE | Freq: Three times a day (TID) | ORAL | 0 refills | Status: AC | PRN

## 2024-03-20 NOTE — ED Provider Notes (Signed)
 " Jocelyn Bean    CSN: [id number] Arrival date & time: 03/20/24  1211      History   Chief Complaint Chief Complaint  Patient presents with   Cough    HPI Jocelyn Bean is a 38 y.o. female.  Patient presents with 3 to 4-day history of fever, runny nose, congestion, sore throat, cough.  Treating with ibuprofen.  No shortness of breath, vomiting, diarrhea.  Patient reports no pertinent medical history.  She is no longer breast-feeding.  The history is provided by the patient and medical records. A language interpreter was used.    History reviewed. No pertinent past medical history.  There are no active problems to display for this patient.   History reviewed. No pertinent surgical history.  OB History     Gravida  1   Para      Term      Preterm      AB      Living         SAB      IAB      Ectopic      Multiple      Live Births               Home Medications    Prior to Admission medications  Medication Sig Start Date End Date Taking? Authorizing Provider  benzonatate  (TESSALON ) 100 MG capsule Take 1 capsule (100 mg total) by mouth 3 (three) times daily as needed for cough. 03/20/24  Yes Corlis Burnard DEL, NP  promethazine -dextromethorphan (PROMETHAZINE -DM) 6.25-15 MG/5ML syrup Take 5 mLs by mouth 4 (four) times daily as needed. 03/20/24  Yes Corlis Burnard DEL, NP  ketorolac  (TORADOL ) 10 MG tablet Take 1 tablet (10 mg total) by mouth every 8 (eight) hours as needed for severe pain. 12/07/18   Goodman, Graydon, MD  norethindrone-ethinyl estradiol (LOESTRIN) 1-20 MG-MCG tablet Take 1 tablet by mouth daily. 10/17/17   [provider]  ondansetron  (ZOFRAN ) 4 MG tablet Take 1 tablet (4 mg total) by mouth every 8 (eight) hours as needed. 12/07/18   Goodman, Graydon, MD  Prenatal Vit-Iron Carbonyl-FA (PRENATABS RX) 29-1 MG TABS Take 1 tablet by mouth daily. 04/18/20   [provider]    Family History History reviewed. No pertinent  family history.  Social History Social History[1]   Allergies   Patient has no known allergies.   Review of Systems Review of Systems  Constitutional:  Positive for fever. Negative for chills.  HENT:  Positive for congestion, rhinorrhea and sore throat. Negative for ear pain.   Respiratory:  Positive for cough. Negative for shortness of breath.   Gastrointestinal:  Negative for diarrhea and vomiting.     Physical Exam Triage Vital Signs ED Triage Vitals  Encounter Vitals Group     BP 03/20/24 1443 128/84     Girls Systolic BP Percentile --      Girls Diastolic BP Percentile --      Boys Systolic BP Percentile --      Boys Diastolic BP Percentile --      Pulse Rate 03/20/24 1443 87     Resp 03/20/24 1443 18     Temp 03/20/24 1443 99.7 F (37.6 C)     Temp Source 03/20/24 1443 Oral     SpO2 03/20/24 1443 98 %     Weight 03/20/24 1443 140 lb (63.5 kg)     Height --      Head Circumference --  Peak Flow --      Pain Score 03/20/24 1442 8     Pain Loc --      Pain Education --      Exclude from Growth Chart --    No data found.  Updated Vital Signs BP 128/84 (BP Location: Right Arm)   Pulse 87   Temp 99.7 F (37.6 C) (Oral)   Resp 18   Wt 140 lb (63.5 kg)   LMP 03/03/2024   SpO2 98%   Breastfeeding No   BMI 24.80 kg/m   Visual Acuity Right Eye Distance:   Left Eye Distance:   Bilateral Distance:    Right Eye Near:   Left Eye Near:    Bilateral Near:     Physical Exam Constitutional:      General: She is not in acute distress. HENT:     Right Ear: Tympanic membrane normal.     Left Ear: Tympanic membrane normal.     Nose: Rhinorrhea present.     Mouth/Throat:     Mouth: Mucous membranes are moist.     Pharynx: Posterior oropharyngeal erythema present.  Cardiovascular:     Rate and Rhythm: Normal rate and regular rhythm.     Heart sounds: Normal heart sounds.  Pulmonary:     Effort: Pulmonary effort is normal. No respiratory distress.      Breath sounds: Normal breath sounds.  Neurological:     Mental Status: She is alert.      UC Treatments / Results  Labs (all labs ordered are listed, but only abnormal results are displayed) Labs Reviewed  POCT INFLUENZA A/B - Abnormal; Notable for the following components:      Result Value   Influenza B, POC Positive (*)    All other components within normal limits  POC SOFIA SARS ANTIGEN FIA - Normal  POCT RAPID STREP A (OFFICE) - Normal    EKG   Radiology No results found.  Procedures Procedures (including critical care time)  Medications Ordered in UC Medications - No data to display  Initial Impression / Assessment and Plan / UC Course  I have reviewed the triage vital signs and the nursing notes.  Pertinent labs & imaging results that were available during my care of the patient were reviewed by me and considered in my medical decision making (see chart for details).    Influenza B.  Afebrile and vital signs are stable.  Lungs are clear and O2 sat is 98% on room air.  Flu positive for influenza B.  COVID and strep are negative.  Patient is outside the window for treatment with antiviral.  Discussed symptomatic treatment including Tylenol or ibuprofen as needed for fever or discomfort, rest, hydration.  Treating cough with Tessalon  Perles and Promethazine  DM as needed.  Precautions for drowsiness with promethazine  discussed.  Education provided on influenza.  Instructed patient to follow-up with her PCP and ED precautions given.  She agrees to plan of care.  Final Clinical Impressions(s) / UC Diagnoses   Final diagnoses:  Influenza B     Discharge Instructions      Your flu test is positive for influenza B.  Your COVID test is negative.    Take the Tessalon  Perles and Promethazine  DM as directed.  Do not drive, operate machinery, drink alcohol, or perform dangerous activities while taking promethazine  as it may cause drowsiness.    Take Tylenol or ibuprofen  as needed for fever or discomfort.    Follow up  with your primary care provider.  Go to the emergency department if you have worsening symptoms.           ED Prescriptions     Medication Sig Dispense Auth. Provider   benzonatate  (TESSALON ) 100 MG capsule Take 1 capsule (100 mg total) by mouth 3 (three) times daily as needed for cough. 21 capsule Corlis Burnard DEL, NP   promethazine -dextromethorphan (PROMETHAZINE -DM) 6.25-15 MG/5ML syrup Take 5 mLs by mouth 4 (four) times daily as needed. 118 mL Corlis Burnard DEL, NP      PDMP not reviewed this encounter.    [1]  Social History Tobacco Use   Smoking status: Never   Smokeless tobacco: Never  Vaping Use   Vaping status: Never Used  Substance Use Topics   Alcohol use: Never   Drug use: Never     Corlis Burnard DEL, NP 03/20/24 1527  "

## 2024-03-20 NOTE — Discharge Instructions (Addendum)
 Your flu test is positive for influenza B.  Your COVID test is negative.    Take the Tessalon  Perles and Promethazine  DM as directed.  Do not drive, operate machinery, drink alcohol, or perform dangerous activities while taking promethazine  as it may cause drowsiness.    Take Tylenol or ibuprofen as needed for fever or discomfort.    Follow up with your primary care provider.  Go to the emergency department if you have worsening symptoms.

## 2024-03-20 NOTE — ED Triage Notes (Signed)
 Sx x 3 days  Fever Cough Sore throat Runny nose
